# Patient Record
Sex: Male | Born: 1955
Health system: Southern US, Community
[De-identification: ages and names within clinical notes are randomized; demographics above are authoritative.]

---

## 2001-10-10 ENCOUNTER — Ambulatory Visit (HOSPITAL_BASED_OUTPATIENT_CLINIC_OR_DEPARTMENT_OTHER): Admission: RE | Admit: 2001-10-10 | Discharge: 2001-10-10 | Payer: Self-pay | Admitting: *Deleted

## 2001-12-05 ENCOUNTER — Ambulatory Visit (HOSPITAL_BASED_OUTPATIENT_CLINIC_OR_DEPARTMENT_OTHER): Admission: RE | Admit: 2001-12-05 | Discharge: 2001-12-05 | Payer: Self-pay | Admitting: *Deleted

## 2003-04-20 ENCOUNTER — Other Ambulatory Visit: Admission: RE | Admit: 2003-04-20 | Discharge: 2003-04-20 | Payer: Self-pay | Admitting: Otolaryngology

## 2003-05-29 ENCOUNTER — Ambulatory Visit (HOSPITAL_COMMUNITY): Admission: RE | Admit: 2003-05-29 | Discharge: 2003-05-30 | Payer: Self-pay | Admitting: Otolaryngology

## 2003-06-26 ENCOUNTER — Ambulatory Visit: Admission: RE | Admit: 2003-06-26 | Discharge: 2003-09-16 | Payer: Self-pay | Admitting: Radiation Oncology

## 2003-06-30 ENCOUNTER — Encounter: Admission: RE | Admit: 2003-06-30 | Discharge: 2003-06-30 | Payer: Self-pay | Admitting: Dentistry

## 2003-10-22 ENCOUNTER — Ambulatory Visit: Admission: RE | Admit: 2003-10-22 | Discharge: 2003-10-22 | Payer: Self-pay | Admitting: Radiation Oncology

## 2004-01-18 ENCOUNTER — Ambulatory Visit (HOSPITAL_COMMUNITY): Admission: RE | Admit: 2004-01-18 | Discharge: 2004-01-18 | Payer: Self-pay | Admitting: Radiation Oncology

## 2004-01-21 ENCOUNTER — Ambulatory Visit: Admission: RE | Admit: 2004-01-21 | Discharge: 2004-01-21 | Payer: Self-pay | Admitting: Radiation Oncology

## 2004-07-21 ENCOUNTER — Ambulatory Visit: Admission: RE | Admit: 2004-07-21 | Discharge: 2004-07-21 | Payer: Self-pay | Admitting: Radiation Oncology

## 2004-07-29 ENCOUNTER — Ambulatory Visit (HOSPITAL_COMMUNITY): Admission: RE | Admit: 2004-07-29 | Discharge: 2004-07-29 | Payer: Self-pay | Admitting: Radiation Oncology

## 2005-05-04 ENCOUNTER — Ambulatory Visit (HOSPITAL_COMMUNITY): Admission: RE | Admit: 2005-05-04 | Discharge: 2005-05-04 | Payer: Self-pay | Admitting: Otolaryngology

## 2010-06-12 ENCOUNTER — Encounter: Payer: Self-pay | Admitting: Otolaryngology

## 2017-09-28 DIAGNOSIS — I6789 Other cerebrovascular disease: Secondary | ICD-10-CM | POA: Diagnosis not present

## 2019-03-24 ENCOUNTER — Encounter: Payer: Self-pay | Admitting: Gastroenterology

## 2019-04-03 ENCOUNTER — Telehealth: Payer: Self-pay | Admitting: Gastroenterology

## 2019-04-03 NOTE — Telephone Encounter (Signed)
This is Dr. [name] patient from [place].  He inquired about his previous colonoscopy.  Please call him to discuss.

## 2019-04-03 NOTE — Telephone Encounter (Signed)
Patient wants to know when his next colonoscopy is due. I have placed this on your desk for review.

## 2019-04-03 NOTE — Telephone Encounter (Signed)
Reviewed and kept it back on your desk RG

## 2019-04-04 NOTE — Telephone Encounter (Signed)
Spoke with patient states will call back to schedule recall colonoscopy and previsit with Dr.Gupta.

## 2019-05-05 DIAGNOSIS — I6521 Occlusion and stenosis of right carotid artery: Secondary | ICD-10-CM | POA: Diagnosis not present

## 2019-05-05 DIAGNOSIS — R5381 Other malaise: Secondary | ICD-10-CM | POA: Diagnosis not present

## 2019-05-05 DIAGNOSIS — F339 Major depressive disorder, recurrent, unspecified: Secondary | ICD-10-CM | POA: Diagnosis not present

## 2019-05-05 DIAGNOSIS — Z85818 Personal history of malignant neoplasm of other sites of lip, oral cavity, and pharynx: Secondary | ICD-10-CM | POA: Diagnosis not present

## 2019-05-05 DIAGNOSIS — Z0000 Encounter for general adult medical examination without abnormal findings: Secondary | ICD-10-CM | POA: Diagnosis not present

## 2019-05-05 DIAGNOSIS — K219 Gastro-esophageal reflux disease without esophagitis: Secondary | ICD-10-CM | POA: Diagnosis not present

## 2019-05-05 DIAGNOSIS — I10 Essential (primary) hypertension: Secondary | ICD-10-CM | POA: Diagnosis not present

## 2019-05-05 DIAGNOSIS — E538 Deficiency of other specified B group vitamins: Secondary | ICD-10-CM | POA: Diagnosis not present

## 2019-05-05 DIAGNOSIS — Z8673 Personal history of transient ischemic attack (TIA), and cerebral infarction without residual deficits: Secondary | ICD-10-CM | POA: Diagnosis not present

## 2019-05-05 DIAGNOSIS — Z23 Encounter for immunization: Secondary | ICD-10-CM | POA: Diagnosis not present

## 2019-05-05 DIAGNOSIS — Z7289 Other problems related to lifestyle: Secondary | ICD-10-CM | POA: Diagnosis not present

## 2019-05-05 DIAGNOSIS — N400 Enlarged prostate without lower urinary tract symptoms: Secondary | ICD-10-CM | POA: Diagnosis not present

## 2019-05-05 DIAGNOSIS — Z1211 Encounter for screening for malignant neoplasm of colon: Secondary | ICD-10-CM | POA: Diagnosis not present

## 2019-05-05 DIAGNOSIS — R7303 Prediabetes: Secondary | ICD-10-CM | POA: Diagnosis not present

## 2019-05-05 DIAGNOSIS — E782 Mixed hyperlipidemia: Secondary | ICD-10-CM | POA: Diagnosis not present

## 2019-05-26 DIAGNOSIS — Z01818 Encounter for other preprocedural examination: Secondary | ICD-10-CM | POA: Diagnosis not present

## 2019-05-26 DIAGNOSIS — Z800 Family history of malignant neoplasm of digestive organs: Secondary | ICD-10-CM | POA: Diagnosis not present

## 2019-05-26 DIAGNOSIS — K219 Gastro-esophageal reflux disease without esophagitis: Secondary | ICD-10-CM | POA: Diagnosis not present

## 2019-05-26 DIAGNOSIS — K648 Other hemorrhoids: Secondary | ICD-10-CM | POA: Diagnosis not present

## 2019-06-10 DIAGNOSIS — Z03818 Encounter for observation for suspected exposure to other biological agents ruled out: Secondary | ICD-10-CM | POA: Diagnosis not present

## 2019-06-10 DIAGNOSIS — Z20828 Contact with and (suspected) exposure to other viral communicable diseases: Secondary | ICD-10-CM | POA: Diagnosis not present

## 2019-06-10 DIAGNOSIS — R05 Cough: Secondary | ICD-10-CM | POA: Diagnosis not present

## 2019-06-17 DIAGNOSIS — D126 Benign neoplasm of colon, unspecified: Secondary | ICD-10-CM | POA: Diagnosis not present

## 2019-06-17 DIAGNOSIS — K635 Polyp of colon: Secondary | ICD-10-CM | POA: Diagnosis not present

## 2019-06-17 DIAGNOSIS — K5730 Diverticulosis of large intestine without perforation or abscess without bleeding: Secondary | ICD-10-CM | POA: Diagnosis not present

## 2019-06-17 DIAGNOSIS — D122 Benign neoplasm of ascending colon: Secondary | ICD-10-CM | POA: Diagnosis not present

## 2019-06-17 DIAGNOSIS — Z86010 Personal history of colonic polyps: Secondary | ICD-10-CM | POA: Diagnosis not present

## 2019-06-17 DIAGNOSIS — Z800 Family history of malignant neoplasm of digestive organs: Secondary | ICD-10-CM | POA: Diagnosis not present

## 2019-06-17 DIAGNOSIS — Z1211 Encounter for screening for malignant neoplasm of colon: Secondary | ICD-10-CM | POA: Diagnosis not present

## 2019-11-04 DIAGNOSIS — F339 Major depressive disorder, recurrent, unspecified: Secondary | ICD-10-CM | POA: Diagnosis not present

## 2019-11-04 DIAGNOSIS — J301 Allergic rhinitis due to pollen: Secondary | ICD-10-CM | POA: Diagnosis not present

## 2019-11-04 DIAGNOSIS — N400 Enlarged prostate without lower urinary tract symptoms: Secondary | ICD-10-CM | POA: Diagnosis not present

## 2019-11-04 DIAGNOSIS — K219 Gastro-esophageal reflux disease without esophagitis: Secondary | ICD-10-CM | POA: Diagnosis not present

## 2019-11-04 DIAGNOSIS — Z8673 Personal history of transient ischemic attack (TIA), and cerebral infarction without residual deficits: Secondary | ICD-10-CM | POA: Diagnosis not present

## 2019-11-04 DIAGNOSIS — R5383 Other fatigue: Secondary | ICD-10-CM | POA: Diagnosis not present

## 2019-11-04 DIAGNOSIS — E782 Mixed hyperlipidemia: Secondary | ICD-10-CM | POA: Diagnosis not present

## 2019-11-04 DIAGNOSIS — R7989 Other specified abnormal findings of blood chemistry: Secondary | ICD-10-CM | POA: Diagnosis not present

## 2019-11-04 DIAGNOSIS — R5381 Other malaise: Secondary | ICD-10-CM | POA: Diagnosis not present

## 2019-11-04 DIAGNOSIS — E559 Vitamin D deficiency, unspecified: Secondary | ICD-10-CM | POA: Diagnosis not present

## 2019-11-04 DIAGNOSIS — I10 Essential (primary) hypertension: Secondary | ICD-10-CM | POA: Diagnosis not present

## 2019-11-04 DIAGNOSIS — R7303 Prediabetes: Secondary | ICD-10-CM | POA: Diagnosis not present

## 2020-03-15 DIAGNOSIS — F339 Major depressive disorder, recurrent, unspecified: Secondary | ICD-10-CM | POA: Diagnosis not present

## 2020-03-15 DIAGNOSIS — Z7289 Other problems related to lifestyle: Secondary | ICD-10-CM | POA: Diagnosis not present

## 2020-03-22 DIAGNOSIS — F339 Major depressive disorder, recurrent, unspecified: Secondary | ICD-10-CM | POA: Diagnosis not present

## 2020-05-19 DIAGNOSIS — E559 Vitamin D deficiency, unspecified: Secondary | ICD-10-CM | POA: Diagnosis not present

## 2020-05-19 DIAGNOSIS — E538 Deficiency of other specified B group vitamins: Secondary | ICD-10-CM | POA: Diagnosis not present

## 2020-05-19 DIAGNOSIS — Z8673 Personal history of transient ischemic attack (TIA), and cerebral infarction without residual deficits: Secondary | ICD-10-CM | POA: Diagnosis not present

## 2020-05-19 DIAGNOSIS — R5383 Other fatigue: Secondary | ICD-10-CM | POA: Diagnosis not present

## 2020-05-19 DIAGNOSIS — Z7289 Other problems related to lifestyle: Secondary | ICD-10-CM | POA: Diagnosis not present

## 2020-05-19 DIAGNOSIS — F339 Major depressive disorder, recurrent, unspecified: Secondary | ICD-10-CM | POA: Diagnosis not present

## 2020-05-19 DIAGNOSIS — R7303 Prediabetes: Secondary | ICD-10-CM | POA: Diagnosis not present

## 2020-05-19 DIAGNOSIS — N400 Enlarged prostate without lower urinary tract symptoms: Secondary | ICD-10-CM | POA: Diagnosis not present

## 2020-05-19 DIAGNOSIS — K219 Gastro-esophageal reflux disease without esophagitis: Secondary | ICD-10-CM | POA: Diagnosis not present

## 2020-05-19 DIAGNOSIS — I6521 Occlusion and stenosis of right carotid artery: Secondary | ICD-10-CM | POA: Diagnosis not present

## 2020-05-19 DIAGNOSIS — E782 Mixed hyperlipidemia: Secondary | ICD-10-CM | POA: Diagnosis not present

## 2020-05-19 DIAGNOSIS — Z125 Encounter for screening for malignant neoplasm of prostate: Secondary | ICD-10-CM | POA: Diagnosis not present

## 2020-05-19 DIAGNOSIS — I10 Essential (primary) hypertension: Secondary | ICD-10-CM | POA: Diagnosis not present

## 2020-05-19 DIAGNOSIS — R5381 Other malaise: Secondary | ICD-10-CM | POA: Diagnosis not present

## 2020-05-19 DIAGNOSIS — Z85818 Personal history of malignant neoplasm of other sites of lip, oral cavity, and pharynx: Secondary | ICD-10-CM | POA: Diagnosis not present

## 2020-05-19 DIAGNOSIS — Z0000 Encounter for general adult medical examination without abnormal findings: Secondary | ICD-10-CM | POA: Diagnosis not present

## 2020-06-02 DIAGNOSIS — U071 COVID-19: Secondary | ICD-10-CM | POA: Diagnosis not present

## 2020-06-02 DIAGNOSIS — Z20822 Contact with and (suspected) exposure to covid-19: Secondary | ICD-10-CM | POA: Diagnosis not present

## 2020-06-02 DIAGNOSIS — J00 Acute nasopharyngitis [common cold]: Secondary | ICD-10-CM | POA: Diagnosis not present

## 2021-01-14 DIAGNOSIS — M223X2 Other derangements of patella, left knee: Secondary | ICD-10-CM | POA: Diagnosis not present

## 2021-01-14 DIAGNOSIS — M25562 Pain in left knee: Secondary | ICD-10-CM | POA: Diagnosis not present

## 2021-01-14 DIAGNOSIS — S8002XA Contusion of left knee, initial encounter: Secondary | ICD-10-CM | POA: Diagnosis not present

## 2021-01-18 ENCOUNTER — Other Ambulatory Visit: Payer: Self-pay | Admitting: Orthopedic Surgery

## 2021-01-18 DIAGNOSIS — M25562 Pain in left knee: Secondary | ICD-10-CM

## 2021-01-18 DIAGNOSIS — M25362 Other instability, left knee: Secondary | ICD-10-CM | POA: Diagnosis not present

## 2021-01-18 DIAGNOSIS — M25462 Effusion, left knee: Secondary | ICD-10-CM | POA: Diagnosis not present

## 2021-01-19 ENCOUNTER — Ambulatory Visit
Admission: RE | Admit: 2021-01-19 | Discharge: 2021-01-19 | Disposition: A | Discharge status: Home / Self Care | Payer: PPO | Source: Ambulatory Visit | Attending: Orthopedic Surgery | Admitting: Orthopedic Surgery

## 2021-01-19 ENCOUNTER — Other Ambulatory Visit: Payer: Self-pay

## 2021-01-19 DIAGNOSIS — M25562 Pain in left knee: Secondary | ICD-10-CM | POA: Diagnosis not present

## 2021-01-25 DIAGNOSIS — S76112D Strain of left quadriceps muscle, fascia and tendon, subsequent encounter: Secondary | ICD-10-CM | POA: Diagnosis not present

## 2021-01-25 DIAGNOSIS — S83242A Other tear of medial meniscus, current injury, left knee, initial encounter: Secondary | ICD-10-CM | POA: Diagnosis not present

## 2021-02-02 DIAGNOSIS — S76112A Strain of left quadriceps muscle, fascia and tendon, initial encounter: Secondary | ICD-10-CM | POA: Diagnosis not present

## 2021-02-02 DIAGNOSIS — G8918 Other acute postprocedural pain: Secondary | ICD-10-CM | POA: Diagnosis not present

## 2021-02-02 DIAGNOSIS — Y999 Unspecified external cause status: Secondary | ICD-10-CM | POA: Diagnosis not present

## 2021-02-02 DIAGNOSIS — X58XXXA Exposure to other specified factors, initial encounter: Secondary | ICD-10-CM | POA: Diagnosis not present

## 2021-02-08 DIAGNOSIS — Z9889 Other specified postprocedural states: Secondary | ICD-10-CM | POA: Diagnosis not present

## 2021-03-02 DIAGNOSIS — M25562 Pain in left knee: Secondary | ICD-10-CM | POA: Diagnosis not present

## 2021-03-09 DIAGNOSIS — M25562 Pain in left knee: Secondary | ICD-10-CM | POA: Diagnosis not present

## 2021-03-16 DIAGNOSIS — M25562 Pain in left knee: Secondary | ICD-10-CM | POA: Diagnosis not present

## 2021-03-23 DIAGNOSIS — M25562 Pain in left knee: Secondary | ICD-10-CM | POA: Diagnosis not present

## 2021-03-30 DIAGNOSIS — M25562 Pain in left knee: Secondary | ICD-10-CM | POA: Diagnosis not present

## 2021-04-06 DIAGNOSIS — M25562 Pain in left knee: Secondary | ICD-10-CM | POA: Diagnosis not present

## 2021-05-09 DIAGNOSIS — Z23 Encounter for immunization: Secondary | ICD-10-CM | POA: Diagnosis not present

## 2022-08-23 ENCOUNTER — Ambulatory Visit: Payer: PPO | Admitting: Podiatry

## 2022-08-23 ENCOUNTER — Ambulatory Visit (INDEPENDENT_AMBULATORY_CARE_PROVIDER_SITE_OTHER): Payer: PPO

## 2022-08-23 DIAGNOSIS — D2371 Other benign neoplasm of skin of right lower limb, including hip: Secondary | ICD-10-CM | POA: Diagnosis not present

## 2022-08-23 DIAGNOSIS — D2372 Other benign neoplasm of skin of left lower limb, including hip: Secondary | ICD-10-CM | POA: Diagnosis not present

## 2022-08-23 DIAGNOSIS — R52 Pain, unspecified: Secondary | ICD-10-CM

## 2023-04-17 ENCOUNTER — Other Ambulatory Visit: Payer: Self-pay | Admitting: Specialist

## 2023-04-17 DIAGNOSIS — R42 Dizziness and giddiness: Secondary | ICD-10-CM

## 2023-04-17 DIAGNOSIS — Z8673 Personal history of transient ischemic attack (TIA), and cerebral infarction without residual deficits: Secondary | ICD-10-CM

## 2023-05-02 ENCOUNTER — Ambulatory Visit
Admission: RE | Admit: 2023-05-02 | Discharge: 2023-05-02 | Disposition: A | Discharge status: Home / Self Care | Payer: PPO | Source: Ambulatory Visit | Attending: Specialist | Admitting: Specialist

## 2023-05-02 DIAGNOSIS — R42 Dizziness and giddiness: Secondary | ICD-10-CM

## 2023-05-02 DIAGNOSIS — Z8673 Personal history of transient ischemic attack (TIA), and cerebral infarction without residual deficits: Secondary | ICD-10-CM

## 2023-05-02 MED ORDER — IOPAMIDOL (ISOVUE-370) INJECTION 76%
75.0000 mL | Freq: Once | INTRAVENOUS | Status: AC | PRN
  Administered 2023-05-02: 75 mL via INTRAVENOUS

## 2023-07-05 IMAGING — MR MR KNEE*L* W/O CM
4 of 7 series · 22 of 40 positions shown · non-contrast
Comparison: None.

CLINICAL DATA: Left knee pain.  Injured 1 week ago.

EXAM:
MRI OF THE LEFT KNEE WITHOUT CONTRAST
TECHNIQUE: Multiplanar, multisequence MR imaging of the knee was performed. No
intravenous contrast was administered.

[Series 4: T2 fat-sat · coronal · 4.0mm · 0.59mm/px · 5 of 24 slices shown (1 of 2)]
[im 1/24]
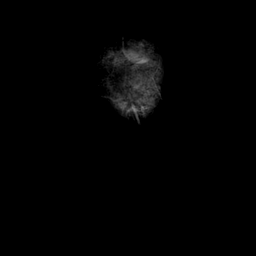
[im 6/24]
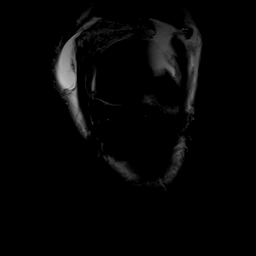
[im 12/24]
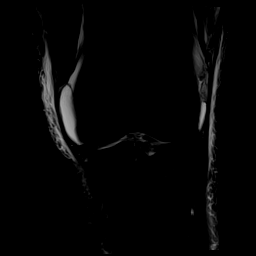
[im 18/24]
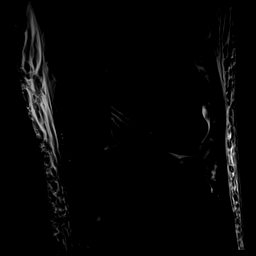
[im 24/24]
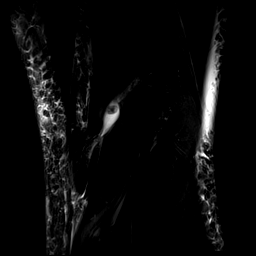

[Series 5: T1 · coronal · 4.0mm · 0.29mm/px · 3 of 24 slices shown]
[im 1/24]
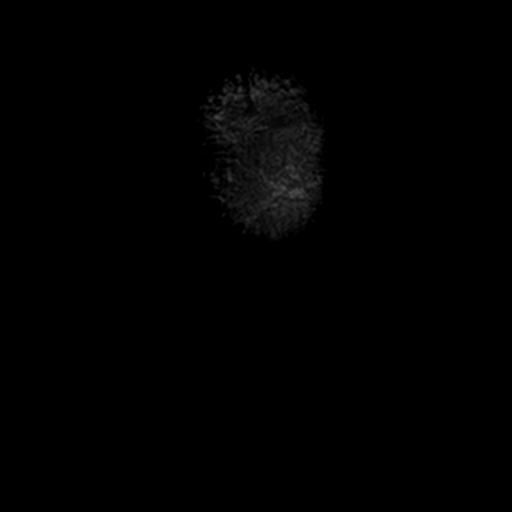
[im 12/24]
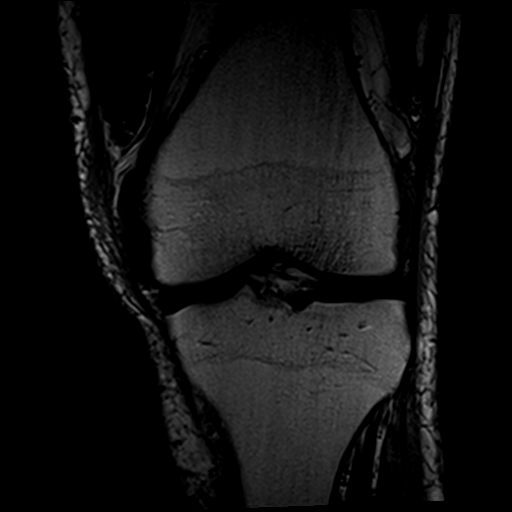
[im 24/24]
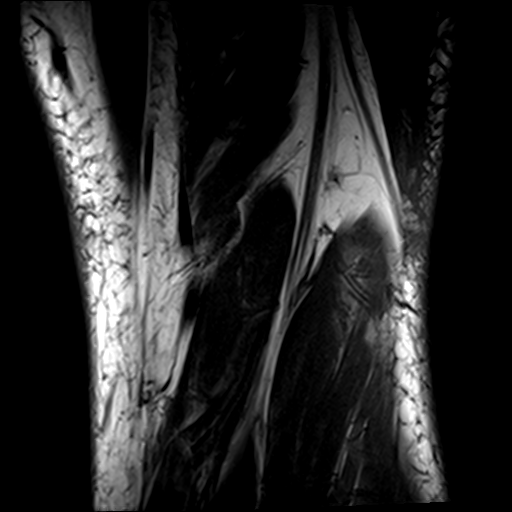

[Series 7: T2 fat-sat · sagittal · 3.0mm · 0.29mm/px · 7 of 30 slices shown (2 of 2)]
[im 1/30]
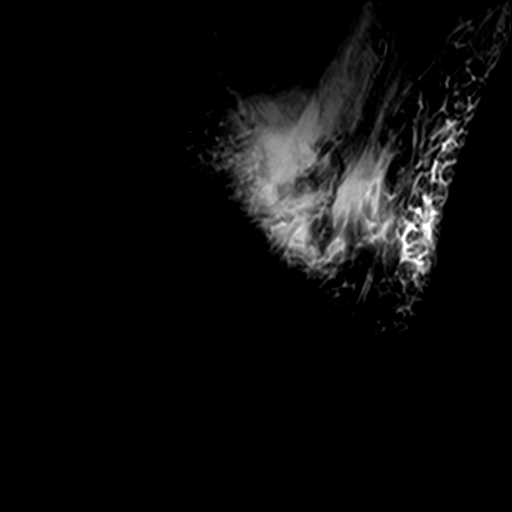
[im 5/30]
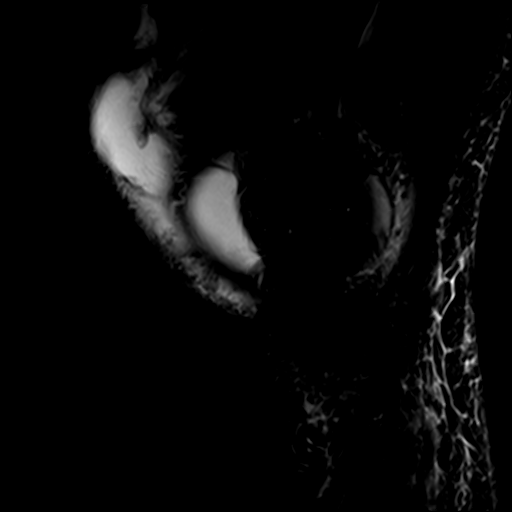
[im 10/30]
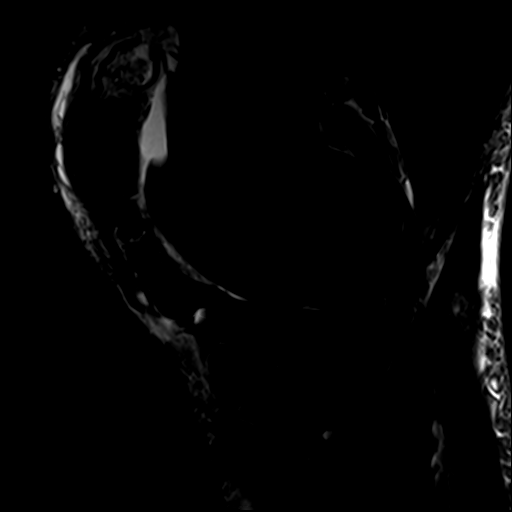
[im 15/30]
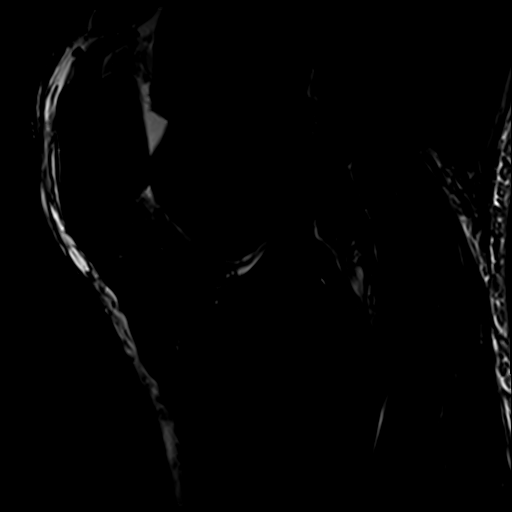
[im 20/30]
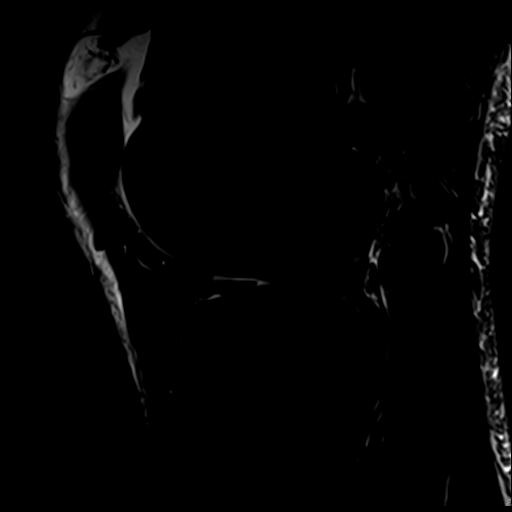
[im 25/30]
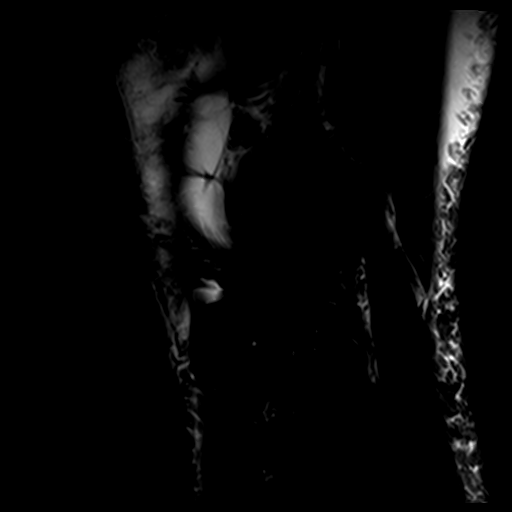
[im 30/30]
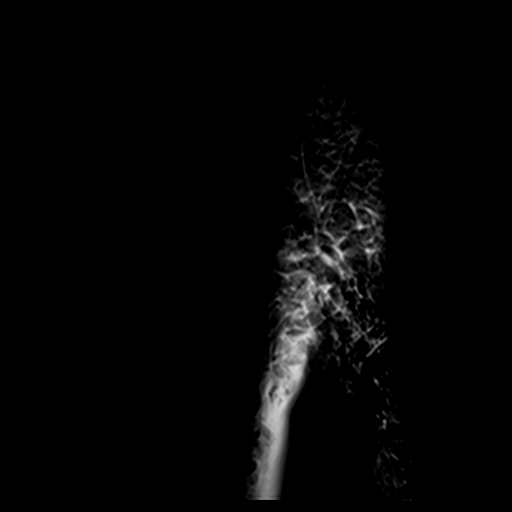

[Series 8: PD fat-sat · sagittal · 3.0mm · 0.29mm/px · 7 of 30 slices shown]
[im 1/30]
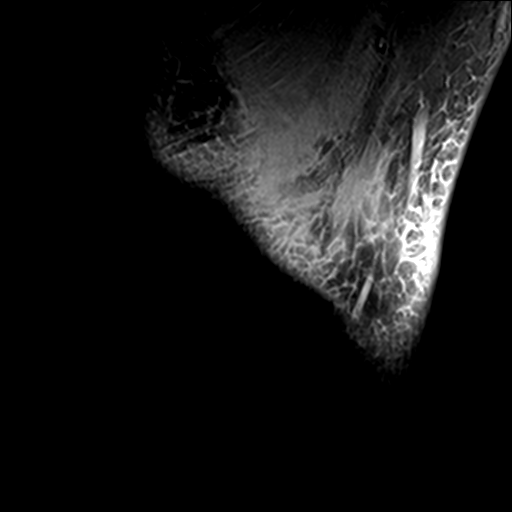
[im 5/30]
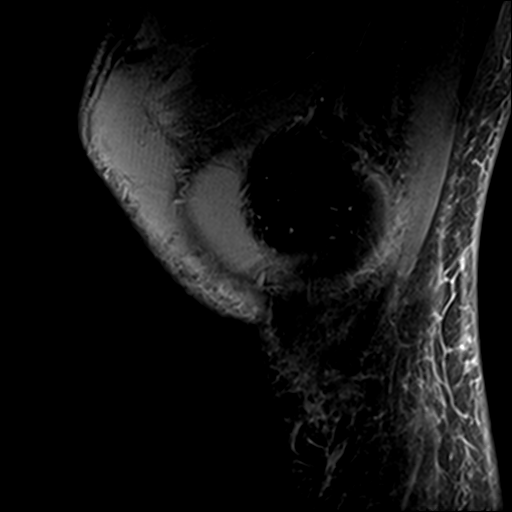
[im 10/30]
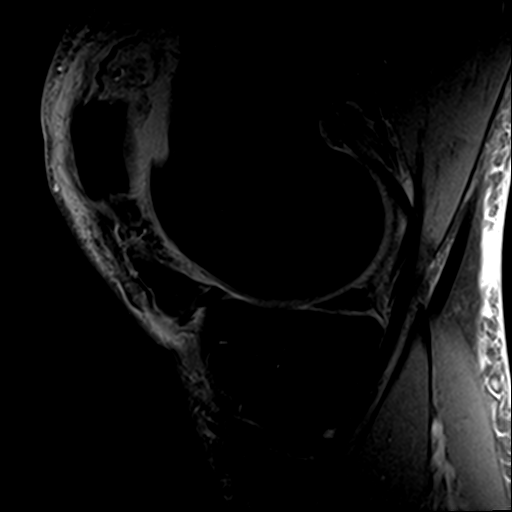
[im 15/30]
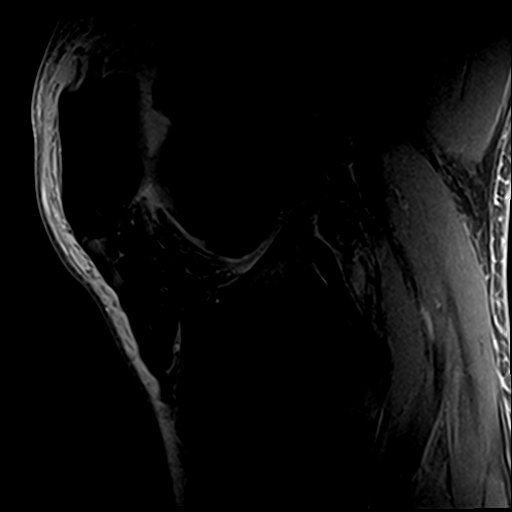
[im 20/30]
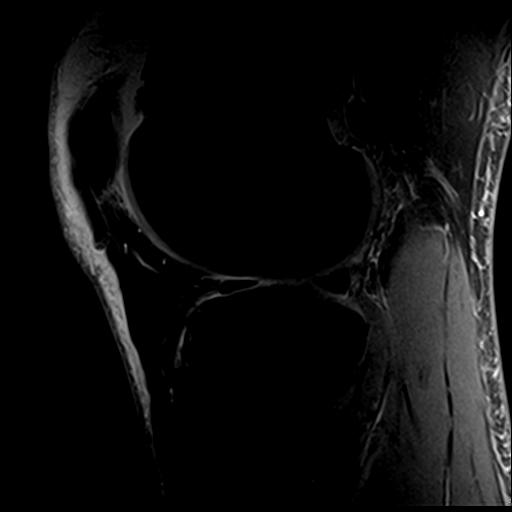
[im 25/30]
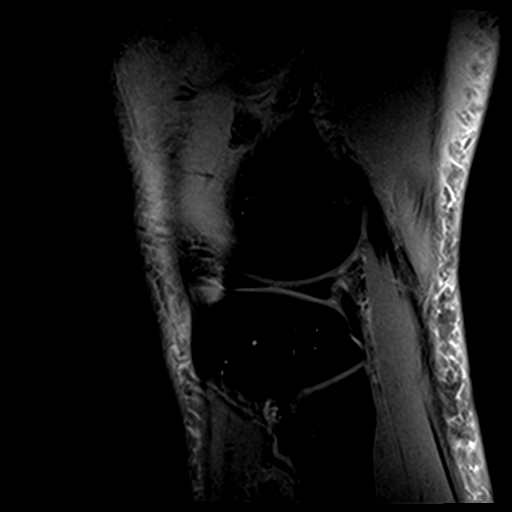
[im 30/30]
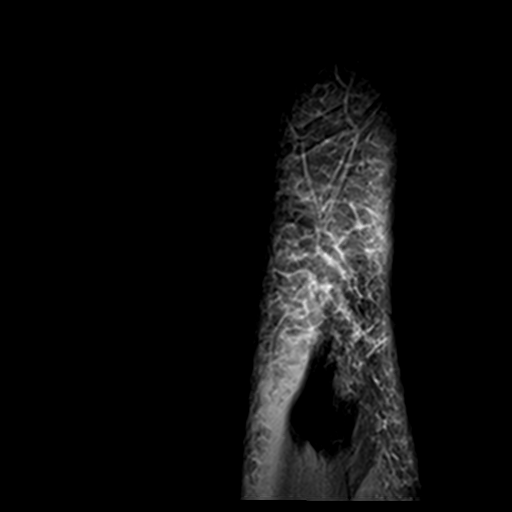

[22 of 40 positions shown; findings below may reference images not displayed]

FINDINGS: MENISCI

Medial: Small undersurface tear of the posterior horn-body junction
of the medial meniscus.

Lateral: Intact.

LIGAMENTS

Cruciates: ACL and PCL are intact.

Collaterals: Medial collateral ligament is intact. Lateral
collateral ligament complex is intact.

CARTILAGE

Patellofemoral: Mild partial-thickness cartilage loss of the lateral
patellofemoral compartment.

Medial:  No chondral defect.

Lateral: Mild cartilage fissuring of the posterior aspect of the
lateral tibial plateau.

JOINT: Moderate joint effusion. Normal Ruun Huseen [place]. No plical
thickening.

POPLITEAL FOSSA: Popliteus tendon is intact. Small Baker's cyst.

EXTENSOR MECHANISM: High-grade partial versus complete tear of the
distal quadriceps tendon. Intact patellar tendon. Intact medial
patellar retinaculum. Intact lateral patellar retinaculum. Intact
MPFL.

BONES: No aggressive osseous lesion. No fracture or dislocation.

Other: No fluid collection or hematoma. Muscles are normal. Fluid in
the subcutaneous fat along the anteromedial aspect of the knee
likely reflecting bursitis. Generalized soft tissue edema around the
knee.
IMPRESSION: 1. High-grade partial versus complete tear of the distal quadriceps
tendon.
2. Small undersurface tear of the posterior horn-body junction of
the medial meniscus.
3. Cartilage abnormalities of the patellofemoral compartment and
lateral femorotibial compartment as described above.
